# Patient Record
Sex: Female | Born: 1955 | Race: Black or African American | Hispanic: No | Marital: Single | Smoking: Never smoker
Health system: Southern US, Community
[De-identification: ages and names within clinical notes are randomized; demographics above are authoritative.]

## PROBLEM LIST (undated history)

## (undated) DIAGNOSIS — F329 Major depressive disorder, single episode, unspecified: Secondary | ICD-10-CM

## (undated) DIAGNOSIS — J45909 Unspecified asthma, uncomplicated: Secondary | ICD-10-CM

## (undated) DIAGNOSIS — K219 Gastro-esophageal reflux disease without esophagitis: Secondary | ICD-10-CM

## (undated) DIAGNOSIS — F32A Depression, unspecified: Secondary | ICD-10-CM

## (undated) DIAGNOSIS — I10 Essential (primary) hypertension: Secondary | ICD-10-CM

---

## 2014-05-28 ENCOUNTER — Encounter (HOSPITAL_BASED_OUTPATIENT_CLINIC_OR_DEPARTMENT_OTHER): Payer: Self-pay | Admitting: Emergency Medicine

## 2014-05-28 ENCOUNTER — Emergency Department (HOSPITAL_COMMUNITY): Payer: BC Managed Care – PPO

## 2014-05-28 ENCOUNTER — Emergency Department (HOSPITAL_BASED_OUTPATIENT_CLINIC_OR_DEPARTMENT_OTHER): Payer: BC Managed Care – PPO

## 2014-05-28 ENCOUNTER — Observation Stay (HOSPITAL_BASED_OUTPATIENT_CLINIC_OR_DEPARTMENT_OTHER)
Admission: EM | Admit: 2014-05-28 | Discharge: 2014-05-30 | Disposition: A | Discharge status: Home / Self Care | Payer: BC Managed Care – PPO | Attending: Internal Medicine | Admitting: Internal Medicine

## 2014-05-28 DIAGNOSIS — G459 Transient cerebral ischemic attack, unspecified: Secondary | ICD-10-CM | POA: Diagnosis present

## 2014-05-28 DIAGNOSIS — E119 Type 2 diabetes mellitus without complications: Secondary | ICD-10-CM | POA: Insufficient documentation

## 2014-05-28 DIAGNOSIS — R1012 Left upper quadrant pain: Secondary | ICD-10-CM | POA: Diagnosis not present

## 2014-05-28 DIAGNOSIS — F329 Major depressive disorder, single episode, unspecified: Secondary | ICD-10-CM | POA: Insufficient documentation

## 2014-05-28 DIAGNOSIS — R29898 Other symptoms and signs involving the musculoskeletal system: Secondary | ICD-10-CM | POA: Insufficient documentation

## 2014-05-28 DIAGNOSIS — J45909 Unspecified asthma, uncomplicated: Secondary | ICD-10-CM | POA: Insufficient documentation

## 2014-05-28 DIAGNOSIS — R51 Headache: Secondary | ICD-10-CM | POA: Diagnosis not present

## 2014-05-28 DIAGNOSIS — K219 Gastro-esophageal reflux disease without esophagitis: Secondary | ICD-10-CM | POA: Insufficient documentation

## 2014-05-28 DIAGNOSIS — Z79899 Other long term (current) drug therapy: Secondary | ICD-10-CM | POA: Insufficient documentation

## 2014-05-28 DIAGNOSIS — Z7982 Long term (current) use of aspirin: Secondary | ICD-10-CM | POA: Diagnosis not present

## 2014-05-28 DIAGNOSIS — R61 Generalized hyperhidrosis: Secondary | ICD-10-CM | POA: Diagnosis not present

## 2014-05-28 DIAGNOSIS — F3289 Other specified depressive episodes: Secondary | ICD-10-CM | POA: Insufficient documentation

## 2014-05-28 DIAGNOSIS — I10 Essential (primary) hypertension: Secondary | ICD-10-CM | POA: Diagnosis not present

## 2014-05-28 DIAGNOSIS — G458 Other transient cerebral ischemic attacks and related syndromes: Secondary | ICD-10-CM

## 2014-05-28 DIAGNOSIS — R0602 Shortness of breath: Secondary | ICD-10-CM | POA: Diagnosis not present

## 2014-05-28 DIAGNOSIS — K7689 Other specified diseases of liver: Secondary | ICD-10-CM | POA: Insufficient documentation

## 2014-05-28 HISTORY — DX: Gastro-esophageal reflux disease without esophagitis: K21.9

## 2014-05-28 HISTORY — DX: Depression, unspecified: F32.A

## 2014-05-28 HISTORY — DX: Unspecified asthma, uncomplicated: J45.909

## 2014-05-28 HISTORY — DX: Essential (primary) hypertension: I10

## 2014-05-28 HISTORY — DX: Major depressive disorder, single episode, unspecified: F32.9

## 2014-05-28 LAB — CBC
HEMATOCRIT: 38.4 % (ref 36.0–46.0)
Hemoglobin: 12.8 g/dL (ref 12.0–15.0)
MCH: 29 pg (ref 26.0–34.0)
MCHC: 33.3 g/dL (ref 30.0–36.0)
MCV: 87.1 fL (ref 78.0–100.0)
PLATELETS: 271 10*3/uL (ref 150–400)
RBC: 4.41 MIL/uL (ref 3.87–5.11)
RDW: 13.3 % (ref 11.5–15.5)
WBC: 10.5 10*3/uL (ref 4.0–10.5)

## 2014-05-28 LAB — COMPREHENSIVE METABOLIC PANEL
ALT: 27 U/L (ref 0–35)
ANION GAP: 12 (ref 5–15)
AST: 22 U/L (ref 0–37)
Albumin: 3.8 g/dL (ref 3.5–5.2)
Alkaline Phosphatase: 66 U/L (ref 39–117)
BILIRUBIN TOTAL: 0.2 mg/dL — AB (ref 0.3–1.2)
BUN: 13 mg/dL (ref 6–23)
CHLORIDE: 101 meq/L (ref 96–112)
CO2: 30 mEq/L (ref 19–32)
CREATININE: 0.9 mg/dL (ref 0.50–1.10)
Calcium: 9.7 mg/dL (ref 8.4–10.5)
GFR calc Af Amer: 80 mL/min — ABNORMAL LOW (ref >90)
GFR calc non Af Amer: 69 mL/min — ABNORMAL LOW (ref >90)
Glucose, Bld: 130 mg/dL — ABNORMAL HIGH (ref 70–99)
Potassium: 3.7 mEq/L (ref 3.7–5.3)
Sodium: 143 mEq/L (ref 137–147)
Total Protein: 7.2 g/dL (ref 6.0–8.3)

## 2014-05-28 LAB — TROPONIN I: Troponin I: <0.3 ng/mL (ref <0.30)

## 2014-05-28 NOTE — ED Notes (Signed)
Dr. Walden at [facility].  

## 2014-05-28 NOTE — ED Provider Notes (Signed)
CSN: [id number]     Arrival date & time 05/28/14  2120 History   First MD Initiated Contact with Patient 05/28/14 2139     Chief Complaint  Patient presents with  . Numbness     (Consider location/radiation/quality/duration/timing/severity/associated sxs/prior Treatment) HPI Inas Avena is a 58 y.o. female that presents to the ED for right arm weakness and numbness. Patient with history of Hypertension. At 2109 tonight, patient developed numbness and inability to move her arm off of the dining room table. She had associated diaphoresis, shortness of breath and an occipital headache. She took two 81mg  aspirin and promptly headed to the ED via transportation by daughter. On the way to the ED, symptoms resolved and she regained use of her arm. No chest pain. No lower extremity weakness. No facial droop. No changes in vision. No nausea or vomiting and no loss of consciousness.  Past Medical History  Diagnosis Date  . Hypertension   . Depression   . Asthma   GERD  Past Surgical History  Procedure Laterality Date  . Cesarean section     No family history on file. FH - Dad heart attack  History  Substance Use Topics  . Smoking status: Never Smoker   . Smokeless tobacco: Not on file  . Alcohol Use: No   OB History   Grav Para Term Preterm Abortions TAB SAB Ect Mult Living                 Review of Systems  Constitutional: Positive for diaphoresis. Negative for fever.  Respiratory: Positive for shortness of breath.   Cardiovascular: Negative for chest pain and palpitations.  Gastrointestinal: Negative for nausea and vomiting.  Neurological: Positive for weakness, numbness and headaches. Negative for tremors, seizures, syncope, facial asymmetry, speech difficulty and light-headedness.  All other systems reviewed and are negative.     Allergies  Review of patient's allergies indicates no known allergies.  Home Medications   Prior to Admission medications   Medication Sig  Start Date End Date Taking? Authorizing Provider  aspirin 81 MG chewable tablet Chew 81 mg by mouth daily.   Yes Historical Provider, MD  cyclobenzaprine (FLEXERIL) 10 MG tablet Take 10 mg by mouth 3 (three) times daily as needed for muscle spasms.   Yes Historical Provider, MD  diltiazem (CARDIZEM) 30 MG tablet Take 30 mg by mouth 4 (four) times daily.   Yes Historical Provider, MD  lisinopril-hydrochlorothiazide (PRINZIDE,ZESTORETIC) 20-25 MG per tablet Take 1 tablet by mouth daily.   Yes Historical Provider, MD  pantoprazole (PROTONIX) 40 MG tablet Take 40 mg by mouth daily.   Yes Historical Provider, MD  PARoxetine (PAXIL) 20 MG tablet Take 20 mg by mouth daily.   Yes Historical Provider, MD  potassium chloride (K-DUR) 10 MEQ tablet Take 10 mEq by mouth daily.   Yes Historical Provider, MD  ranitidine (ZANTAC) 150 MG capsule Take 150 mg by mouth 2 (two) times daily.   Yes Historical Provider, MD   BP 134/60  Pulse 86  Temp(Src) 99 F (37.2 C) (Oral)  Resp 22  Ht 5\' 3"  (1.6 m)  Wt 298 lb (135.172 kg)  BMI 52.80 kg/m2  SpO2 98% Physical Exam  Constitutional: She is oriented to person, place, and time. She appears well-developed and well-nourished.  Eyes: Conjunctivae and EOM are normal. Pupils are equal, round, and reactive to light.  Cardiovascular: Normal rate, regular rhythm and normal heart sounds.   Pulmonary/Chest: Effort normal and breath sounds normal. No respiratory  distress. She has no wheezes. She has no rales.  Abdominal: Soft. Bowel sounds are normal.  Musculoskeletal: Normal range of motion. She exhibits no edema and no tenderness.  Neurological: She is alert and oriented to person, place, and time. She has normal strength. No cranial nerve deficit or sensory deficit. Coordination and gait normal.  Reflex Scores:      Bicep reflexes are 2+ on the right side and 2+ on the left side.      Brachioradialis reflexes are 2+ on the right side and 2+ on the left side.       Patellar reflexes are 2+ on the right side and 2+ on the left side. Skin: Skin is warm and dry.    ED Course  Procedures (including critical care time) Medications - No data to display Labs Review Labs Reviewed  COMPREHENSIVE METABOLIC PANEL - Abnormal; Notable for the following:    Glucose, Bld 130 (*)    Total Bilirubin 0.2 (*)    GFR calc non Af Amer 69 (*)    GFR calc Af Amer 80 (*)    All other components within normal limits  CBC  TROPONIN I  URINALYSIS, ROUTINE W REFLEX MICROSCOPIC    Imaging Review Dg Chest 2 View  05/28/2014   CLINICAL DATA:  Right arm and right hand numbness, extending to the right shoulder. Shortness of breath. History of asthma.  EXAM: CHEST  2 VIEW  COMPARISON:  None.  FINDINGS: The lungs are well-aerated and clear. There is no evidence of focal opacification, pleural effusion or pneumothorax.  The heart is normal in size; the mediastinal contour is within normal limits. No acute osseous abnormalities are seen.  IMPRESSION: No acute cardiopulmonary process seen.   Electronically Signed   By: Roanna Raider M.D.   On: 05/28/2014 22:39   Ct Head Wo Contrast  05/28/2014   CLINICAL DATA:  Right-sided headache and right-sided numbness.  EXAM: CT HEAD WITHOUT CONTRAST  TECHNIQUE: Contiguous axial images were obtained from the base of the skull through the vertex without intravenous contrast.  COMPARISON:  None.  FINDINGS: There is no evidence of acute infarction, mass lesion, or intra- or extra-axial hemorrhage on CT. Evaluation is mildly suboptimal due to motion artifact.  The posterior fossa, including the cerebellum, brainstem and fourth ventricle, is within normal limits. The third and lateral ventricles, and basal ganglia are unremarkable in appearance. The cerebral hemispheres are symmetric in appearance, with normal gray-white differentiation. No mass effect or midline shift is seen.  There is no evidence of fracture; visualized osseous structures are  unremarkable in appearance. The visualized portions of the orbits are within normal limits. The paranasal sinuses and mastoid air cells are well-aerated. No significant soft tissue abnormalities are seen.  IMPRESSION: Unremarkable noncontrast CT of the head.   Electronically Signed   By: Roanna Raider M.D.   On: 05/28/2014 22:42     EKG Interpretation   Date/Time:  Tuesday May 28 2014 21:37:36 EDT Ventricular Rate:  86 PR Interval:  156 QRS Duration: 82 QT Interval:  392 QTC Calculation: 469 R Axis:   76 Text Interpretation:  Normal sinus rhythm Normal ECG No prior Confirmed by  Gwendolyn Grant  MD, BLAIR ([id number]) on 05/28/2014 9:38:44 PM      MDM   Final diagnoses:  Other specified transient cerebral ischemias  Right arm weakness   Patient's symptoms consistent with TIA and possible ACS. Labs essentially unremarkable. Initial troponin negative. EKG not suggestive of acute ischemia. CT  head shows no acute infarct or hemorrhage. Patient currently without symptoms of numbness, chest pain, shortness of breath or weakness. Will need admission for work-up. Spoke with Dr. Onalee Huaavid who is admitting. Patient placed in observation, telemetry bed.   Jacquelin Hawkingalph Wendy Hoback, MD 05/28/14 [phone]

## 2014-05-28 NOTE — ED Notes (Signed)
Pt. Reports she felt numbness in the R arm and R hand up to her R shoulder at approx. 2109 tonight. Pt. Able to move her R arm at present time.  Pt. Daughter reports the Pt. Called her name multiple times at the time of her having numbness in the R arm and hand.  Pt. Felt pain in the R arm as well.  Pt. Arm and grip WNL in triage.  Pt. Alert and oriented with no resp. Distress noted.

## 2014-05-28 NOTE — ED Notes (Signed)
MD at bedside. 

## 2014-05-28 NOTE — ED Notes (Signed)
To CT

## 2014-05-29 ENCOUNTER — Observation Stay (HOSPITAL_COMMUNITY): Payer: BC Managed Care – PPO

## 2014-05-29 ENCOUNTER — Encounter (HOSPITAL_COMMUNITY): Payer: Self-pay | Admitting: Internal Medicine

## 2014-05-29 DIAGNOSIS — G458 Other transient cerebral ischemic attacks and related syndromes: Secondary | ICD-10-CM

## 2014-05-29 DIAGNOSIS — R29898 Other symptoms and signs involving the musculoskeletal system: Secondary | ICD-10-CM

## 2014-05-29 DIAGNOSIS — I10 Essential (primary) hypertension: Secondary | ICD-10-CM | POA: Diagnosis present

## 2014-05-29 LAB — LIPID PANEL
CHOL/HDL RATIO: 2.9 ratio
CHOLESTEROL: 168 mg/dL (ref 0–200)
HDL: 58 mg/dL (ref >39)
LDL Cholesterol: 88 mg/dL (ref 0–99)
Triglycerides: 108 mg/dL (ref <150)
VLDL: 22 mg/dL (ref 0–40)

## 2014-05-29 LAB — URINALYSIS, ROUTINE W REFLEX MICROSCOPIC
BILIRUBIN URINE: NEGATIVE
Glucose, UA: NEGATIVE mg/dL
Hgb urine dipstick: NEGATIVE
KETONES UR: NEGATIVE mg/dL
LEUKOCYTES UA: NEGATIVE
NITRITE: NEGATIVE
Protein, ur: NEGATIVE mg/dL
Specific Gravity, Urine: 1.019 (ref 1.005–1.030)
UROBILINOGEN UA: 0.2 mg/dL (ref 0.0–1.0)
pH: 5.5 (ref 5.0–8.0)

## 2014-05-29 LAB — HEMOGLOBIN A1C
Hgb A1c MFr Bld: 7 % — ABNORMAL HIGH (ref <5.7)
MEAN PLASMA GLUCOSE: 154 mg/dL — AB (ref <117)

## 2014-05-29 LAB — LIPASE, BLOOD: Lipase: 27 U/L (ref 11–59)

## 2014-05-29 MED ORDER — FAMOTIDINE 10 MG PO TABS
10.0000 mg | ORAL_TABLET | Freq: Two times a day (BID) | ORAL | Status: DC
  Administered 2014-05-29 – 2014-05-30 (×3): 10 mg via ORAL
  Filled 2014-05-29 (×3): qty 1

## 2014-05-29 MED ORDER — CYCLOBENZAPRINE HCL 10 MG PO TABS: 10.0000 mg | ORAL_TABLET | Freq: Three times a day (TID) | ORAL | Status: DC | PRN

## 2014-05-29 MED ORDER — STROKE: EARLY STAGES OF RECOVERY BOOK
Freq: Once | Status: DC
  Filled 2014-05-29: qty 1

## 2014-05-29 MED ORDER — DILTIAZEM HCL 30 MG PO TABS
30.0000 mg | ORAL_TABLET | Freq: Four times a day (QID) | ORAL | Status: DC
  Administered 2014-05-29 – 2014-05-30 (×7): 30 mg via ORAL
  Filled 2014-05-29 (×7): qty 1

## 2014-05-29 MED ORDER — POTASSIUM CHLORIDE ER 10 MEQ PO TBCR
10.0000 meq | EXTENDED_RELEASE_TABLET | Freq: Every day | ORAL | Status: DC
  Administered 2014-05-29 – 2014-05-30 (×2): 10 meq via ORAL
  Filled 2014-05-29 (×4): qty 1

## 2014-05-29 MED ORDER — HEPARIN SODIUM (PORCINE) 5000 UNIT/ML IJ SOLN
5000.0000 [IU] | Freq: Three times a day (TID) | INTRAMUSCULAR | Status: DC
  Administered 2014-05-29 – 2014-05-30 (×5): 5000 [IU] via SUBCUTANEOUS
  Filled 2014-05-29 (×5): qty 1

## 2014-05-29 MED ORDER — PANTOPRAZOLE SODIUM 40 MG PO TBEC
40.0000 mg | DELAYED_RELEASE_TABLET | Freq: Every day | ORAL | Status: DC
  Administered 2014-05-29 – 2014-05-30 (×2): 40 mg via ORAL
  Filled 2014-05-29 (×2): qty 1

## 2014-05-29 MED ORDER — LISINOPRIL-HYDROCHLOROTHIAZIDE 20-25 MG PO TABS: 1.0000 | ORAL_TABLET | Freq: Every day | ORAL | Status: DC

## 2014-05-29 MED ORDER — PAROXETINE HCL 20 MG PO TABS
20.0000 mg | ORAL_TABLET | Freq: Every day | ORAL | Status: DC
  Administered 2014-05-29 – 2014-05-30 (×2): 20 mg via ORAL
  Filled 2014-05-29 (×2): qty 1

## 2014-05-29 MED ORDER — ASPIRIN 81 MG PO CHEW
81.0000 mg | CHEWABLE_TABLET | Freq: Every day | ORAL | Status: DC
  Administered 2014-05-29 – 2014-05-30 (×2): 81 mg via ORAL
  Filled 2014-05-29 (×2): qty 1

## 2014-05-29 MED ORDER — LISINOPRIL 20 MG PO TABS
20.0000 mg | ORAL_TABLET | Freq: Every day | ORAL | Status: DC
  Administered 2014-05-29 – 2014-05-30 (×2): 20 mg via ORAL
  Filled 2014-05-29 (×2): qty 1

## 2014-05-29 MED ORDER — METFORMIN HCL 500 MG PO TABS
500.0000 mg | ORAL_TABLET | Freq: Two times a day (BID) | ORAL | Status: DC
  Administered 2014-05-29 – 2014-05-30 (×3): 500 mg via ORAL
  Filled 2014-05-29 (×3): qty 1

## 2014-05-29 MED ORDER — HYDROCHLOROTHIAZIDE 25 MG PO TABS
25.0000 mg | ORAL_TABLET | Freq: Every day | ORAL | Status: DC
  Administered 2014-05-29 – 2014-05-30 (×2): 25 mg via ORAL
  Filled 2014-05-29 (×2): qty 1

## 2014-05-29 NOTE — H&P (Signed)
[facility] History and Physical  Karen Herring WUJ:[id number] DOB: 08/04/1956 DOA: 05/28/2014  Referring physician: EDP PCP: No primary provider on file.   Chief Complaint: RUE weakness   HPI: Karen Herring is a 58 y.o. female who presents to the ED at [facility] with transient RUE weakness that occurred earlier this evening while eating dinner.  Symptoms lasted 15 mins in duration and resolved without intervention en-route to the ED.  Patient is now symptom free.  Patient does not report any difficulty with speech or know of any facial droop during the arm symptom episode.  She was also able to ambulate to car without difficulty she reports although she initially had been "afraid she wouldn't be able to get up".  Patient has been transferred to [facility] for TIA work up.  Review of Systems: Systems reviewed.  As above, otherwise negative  Past Medical History  Diagnosis Date  . Hypertension   . Depression   . Asthma    Past Surgical History  Procedure Laterality Date  . Cesarean section     Social History:  reports that she has never smoked. She does not have any smokeless tobacco history on file. She reports that she does not drink alcohol. Her drug history is not on file.  No Known Allergies  No family history on file.   Prior to Admission medications   Medication Sig Start Date End Date Taking? Authorizing Provider  aspirin 81 MG chewable tablet Chew 81 mg by mouth daily.   Yes Historical Provider, MD  cyclobenzaprine (FLEXERIL) 10 MG tablet Take 10 mg by mouth 3 (three) times daily as needed for muscle spasms.   Yes Historical Provider, MD  diltiazem (CARDIZEM) 30 MG tablet Take 30 mg by mouth 4 (four) times daily.   Yes Historical Provider, MD  lisinopril-hydrochlorothiazide (PRINZIDE,ZESTORETIC) 20-25 MG per tablet Take 1 tablet by mouth daily.   Yes Historical Provider, MD  pantoprazole (PROTONIX) 40 MG tablet Take 40 mg by mouth daily.   Yes Historical Provider, MD  PARoxetine  (PAXIL) 20 MG tablet Take 20 mg by mouth daily.   Yes Historical Provider, MD  potassium chloride (K-DUR) 10 MEQ tablet Take 10 mEq by mouth daily.   Yes Historical Provider, MD  ranitidine (ZANTAC) 150 MG capsule Take 150 mg by mouth 2 (two) times daily.   Yes Historical Provider, MD   Physical Exam: Filed Vitals:   05/29/14 0136  BP: 179/70  Pulse: 80  Temp: 99.2 F (37.3 C)  Resp: 20    BP 179/70  Pulse 80  Temp(Src) 99.2 F (37.3 C) (Oral)  Resp 20  Ht 5\' 3"  (1.6 m)  Wt 135.5 kg (298 lb 11.6 oz)  BMI 52.93 kg/m2  SpO2 95%  General Appearance:    Alert, oriented, no distress, appears stated age  Head:    Normocephalic, atraumatic  Eyes:    PERRL, EOMI, sclera non-icteric        Nose:   Nares without drainage or epistaxis. Mucosa, turbinates normal  Throat:   Moist mucous membranes. Oropharynx without erythema or exudate.  Neck:   Supple. No carotid bruits.  No thyromegaly.  No lymphadenopathy.   Back:     No CVA tenderness, no spinal tenderness  Lungs:     Clear to auscultation bilaterally, without wheezes, rhonchi or rales  Chest wall:    No tenderness to palpitation  Heart:    Regular rate and rhythm without murmurs, gallops, rubs  Abdomen:     Soft,  non-tender, nondistended, normal bowel sounds, no organomegaly  Genitalia:    deferred  Rectal:    deferred  Extremities:   No clubbing, cyanosis or edema.  Pulses:   2+ and symmetric all extremities  Skin:   Skin color, texture, turgor normal, no rashes or lesions  Lymph nodes:   Cervical, supraclavicular, and axillary nodes normal  Neurologic:   CNII-XII intact. Normal strength, sensation and reflexes      throughout    Labs on Admission:  Basic Metabolic Panel:  Recent Labs Lab 05/28/14 2227  NA 143  K 3.7  CL 101  CO2 30  GLUCOSE 130*  BUN 13  CREATININE 0.90  CALCIUM 9.7   Liver Function Tests:  Recent Labs Lab 05/28/14 2227  AST 22  ALT 27  ALKPHOS 66  BILITOT 0.2*  PROT 7.2  ALBUMIN 3.8    No results found for this basename: LIPASE, AMYLASE,  in the last 168 hours No results found for this basename: AMMONIA,  in the last 168 hours CBC:  Recent Labs Lab 05/28/14 2227  WBC 10.5  HGB 12.8  HCT 38.4  MCV 87.1  PLT 271   Cardiac Enzymes:  Recent Labs Lab 05/28/14 2227  TROPONINI <0.30    BNP (last 3 results) No results found for this basename: PROBNP,  in the last 8760 hours CBG: No results found for this basename: GLUCAP,  in the last 168 hours  Radiological Exams on Admission: Dg Chest 2 View  05/28/2014   CLINICAL DATA:  Right arm and right hand numbness, extending to the right shoulder. Shortness of breath. History of asthma.  EXAM: CHEST  2 VIEW  COMPARISON:  None.  FINDINGS: The lungs are well-aerated and clear. There is no evidence of focal opacification, pleural effusion or pneumothorax.  The heart is normal in size; the mediastinal contour is within normal limits. No acute osseous abnormalities are seen.  IMPRESSION: No acute cardiopulmonary process seen.   Electronically Signed   By: Roanna Raider M.D.   On: 05/28/2014 22:39   Ct Head Wo Contrast  05/28/2014   CLINICAL DATA:  Right-sided headache and right-sided numbness.  EXAM: CT HEAD WITHOUT CONTRAST  TECHNIQUE: Contiguous axial images were obtained from the base of the skull through the vertex without intravenous contrast.  COMPARISON:  None.  FINDINGS: There is no evidence of acute infarction, mass lesion, or intra- or extra-axial hemorrhage on CT. Evaluation is mildly suboptimal due to motion artifact.  The posterior fossa, including the cerebellum, brainstem and fourth ventricle, is within normal limits. The third and lateral ventricles, and basal ganglia are unremarkable in appearance. The cerebral hemispheres are symmetric in appearance, with normal gray-white differentiation. No mass effect or midline shift is seen.  There is no evidence of fracture; visualized osseous structures are unremarkable in  appearance. The visualized portions of the orbits are within normal limits. The paranasal sinuses and mastoid air cells are well-aerated. No significant soft tissue abnormalities are seen.  IMPRESSION: Unremarkable noncontrast CT of the head.   Electronically Signed   By: Roanna Raider M.D.   On: 05/28/2014 22:42    EKG: Independently reviewed.  Assessment/Plan Principal Problem:   TIA (transient ischemic attack)   1. TIA - have ordered MRI/MRA head to evaluate for evidence of ischemia, if present then needs the rest of the stroke work up ordered (2d echo, carotid duplex, etc).  Tele monitor to observe for arrythmia. 2. HTN - continue home meds, cardizem is used for  BP control she reports, does not have a known history of A.Fib or other arrythmia.    Code Status: Full Code  Family Communication: No family in room Disposition Plan: Admit to inpatient   Time spent: 70 min  GARDNER, JARED M. [facility] Pager [phone]  If 7AM-7PM, please contact the day team taking care of the patient [email or web address] Password [facility] [email or web address] 05/29/2014, 4:09 AM

## 2014-05-29 NOTE — Progress Notes (Addendum)
[facility] PROGRESS NOTE  Karen Herring ZOX:[id number] DOB: Apr 18, 1956 DOA: 05/28/2014 PCP: No primary provider on file.  Assessment/Plan: Principal Problem:   TIA (transient ischemic attack) Active Problems:   HTN (hypertension)  Probable TIA R arm weakness and numbness  Continue aspirin for secondary stroke prevention Normal MRI MR angiography MRA negative Triglycerides 108 Hemoglobin A1c 7.0 PT/OT eval 2-D echo pending  Diabetes mellitus, type II Hemoglobin A1c 7.0 Patient agreeable to be started on metformin  Hypertension Continue Cardizem, HCTZ, lisinopril  Left upper quadrant pain Patient complaining of pain under her left breast This is reproducible and likely musculoskeletal Chest x-ray negative Obtain abdominal ultrasound to rule out cyst, gallstones Continue to cycle cardiac enzymes  Code Status: full Family Communication: family updated about patient's clinical progress Disposition Plan:  As above    Brief narrative: 58 y.o. female who presents to the ED at [facility] with transient RUE weakness that occurred earlier this evening while eating dinner. Symptoms lasted 15 mins in duration and resolved without intervention en-route ED. Patient is now symptom free. Patient does not report any difficulty with speech or know of any facial droop during the arm symptom episode. She was also able to ambulate to car without difficulty she reports although she initially had been "afraid she wouldn't be able to get up". Patient has been transferred to [facility] for TIA work up   Consultants:  None  Procedures:  None  Antibiotics:  None  HPI/Subjective: Improved right-sided weakness, not symptom-free  Objective: Filed Vitals:   05/29/14 0553 05/29/14 0800 05/29/14 1000 05/29/14 1351  BP: 138/65 141/65 140/59 125/56  Pulse: 74 68 68 74  Temp: 98.6 F (37 C) 98.4 F (36.9 C) 98.2 F (36.8 C) 98.6 F (37 C)  TempSrc: Oral Oral Oral Oral  Resp: 18 18 18 18    Height:      Weight:      SpO2: 97% 97% 97% 97%    Intake/Output Summary (Last 24 hours) at 05/29/14 1531 Last data filed at 05/29/14 1300  Gross per 24 hour  Intake    360 ml  Output      0 ml  Net    360 ml    Exam:  HENT:  Head: Atraumatic.  Nose: Nose normal.  Mouth/Throat: Oropharynx is clear and moist.  Eyes: Conjunctivae are normal. Pupils are equal, round, and reactive to light. No scleral icterus.  Neck: Neck supple. No tracheal deviation present.  Cardiovascular: Normal rate, regular rhythm, normal heart sounds and intact distal pulses.  Pulmonary/Chest: Effort normal and breath sounds normal. No respiratory distress.  Abdominal: Soft. Normal appearance and bowel sounds are normal. She exhibits no distension. There is no tenderness.  Musculoskeletal: She exhibits no edema and no tenderness.  Neurological: She is alert. No cranial nerve deficit.    Data Reviewed: Basic Metabolic Panel:  Recent Labs Lab 05/28/14 2227  NA 143  K 3.7  CL 101  CO2 30  GLUCOSE 130*  BUN 13  CREATININE 0.90  CALCIUM 9.7    Liver Function Tests:  Recent Labs Lab 05/28/14 2227  AST 22  ALT 27  ALKPHOS 66  BILITOT 0.2*  PROT 7.2  ALBUMIN 3.8   No results found for this basename: LIPASE, AMYLASE,  in the last 168 hours No results found for this basename: AMMONIA,  in the last 168 hours  CBC:  Recent Labs Lab 05/28/14 2227  WBC 10.5  HGB 12.8  HCT 38.4  MCV 87.1  PLT 271  Cardiac Enzymes:  Recent Labs Lab 05/28/14 2227  TROPONINI <0.30   BNP (last 3 results) No results found for this basename: PROBNP,  in the last 8760 hours   CBG: No results found for this basename: GLUCAP,  in the last 168 hours  No results found for this or any previous visit (from the past 240 hour(s)).   Studies: Dg Chest 2 View  05/28/2014   CLINICAL DATA:  Right arm and right hand numbness, extending to the right shoulder. Shortness of breath. History of asthma.  EXAM:  CHEST  2 VIEW  COMPARISON:  None.  FINDINGS: The lungs are well-aerated and clear. There is no evidence of focal opacification, pleural effusion or pneumothorax.  The heart is normal in size; the mediastinal contour is within normal limits. No acute osseous abnormalities are seen.  IMPRESSION: No acute cardiopulmonary process seen.   Electronically Signed   By: Roanna Raider M.D.   On: 05/28/2014 22:39   Ct Head Wo Contrast  05/28/2014   CLINICAL DATA:  Right-sided headache and right-sided numbness.  EXAM: CT HEAD WITHOUT CONTRAST  TECHNIQUE: Contiguous axial images were obtained from the base of the skull through the vertex without intravenous contrast.  COMPARISON:  None.  FINDINGS: There is no evidence of acute infarction, mass lesion, or intra- or extra-axial hemorrhage on CT. Evaluation is mildly suboptimal due to motion artifact.  The posterior fossa, including the cerebellum, brainstem and fourth ventricle, is within normal limits. The third and lateral ventricles, and basal ganglia are unremarkable in appearance. The cerebral hemispheres are symmetric in appearance, with normal gray-white differentiation. No mass effect or midline shift is seen.  There is no evidence of fracture; visualized osseous structures are unremarkable in appearance. The visualized portions of the orbits are within normal limits. The paranasal sinuses and mastoid air cells are well-aerated. No significant soft tissue abnormalities are seen.  IMPRESSION: Unremarkable noncontrast CT of the head.   Electronically Signed   By: Roanna Raider M.D.   On: 05/28/2014 22:42   Mr Brain Wo Contrast  05/29/2014   CLINICAL DATA:  Transient right upper extremity weakness.  EXAM: MRI HEAD WITHOUT CONTRAST  MRA HEAD WITHOUT CONTRAST  TECHNIQUE: Multiplanar, multiecho pulse sequences of the brain and surrounding structures were obtained without intravenous contrast. Angiographic images of the head were obtained using MRA technique without  contrast.  COMPARISON:  Head CT 05/28/2014  FINDINGS: MRI HEAD FINDINGS  The brain has a normal appearance on all pulse sequences without evidence of malformation, atrophy, old or acute infarction, mass lesion, hemorrhage, hydrocephalus or extra-axial collection. No pituitary mass. No fluid in the sinuses, middle ears or mastoids. No skull or skullbase lesion. There is flow in the major vessels at the base of the brain. Major venous sinuses show flow.  MRA HEAD FINDINGS  Both internal carotid arteries are widely patent into the brain. No siphon stenosis. The anterior and middle cerebral vessels are patent without proximal stenosis, aneurysm or vascular malformation.  Both vertebral arteries are patent to the basilar. No vertebral stenosis. Subtraction artifact simulates a left vertebral stenosis. No basilar stenosis. Posterior circulation branch vessels are patent. Large posterior communicating arteries bilaterally.  IMPRESSION: Normal MRI of the brain.  Normal intracranial MR angiography.   Electronically Signed   By: Paulina Fusi M.D.   On: 05/29/2014 13:54   Mr Maxine Glenn Head/brain Wo Cm  05/29/2014   CLINICAL DATA:  Transient right upper extremity weakness.  EXAM: MRI HEAD WITHOUT CONTRAST  MRA HEAD WITHOUT CONTRAST  TECHNIQUE: Multiplanar, multiecho pulse sequences of the brain and surrounding structures were obtained without intravenous contrast. Angiographic images of the head were obtained using MRA technique without contrast.  COMPARISON:  Head CT 05/28/2014  FINDINGS: MRI HEAD FINDINGS  The brain has a normal appearance on all pulse sequences without evidence of malformation, atrophy, old or acute infarction, mass lesion, hemorrhage, hydrocephalus or extra-axial collection. No pituitary mass. No fluid in the sinuses, middle ears or mastoids. No skull or skullbase lesion. There is flow in the major vessels at the base of the brain. Major venous sinuses show flow.  MRA HEAD FINDINGS  Both internal carotid  arteries are widely patent into the brain. No siphon stenosis. The anterior and middle cerebral vessels are patent without proximal stenosis, aneurysm or vascular malformation.  Both vertebral arteries are patent to the basilar. No vertebral stenosis. Subtraction artifact simulates a left vertebral stenosis. No basilar stenosis. Posterior circulation branch vessels are patent. Large posterior communicating arteries bilaterally.  IMPRESSION: Normal MRI of the brain.  Normal intracranial MR angiography.   Electronically Signed   By: Paulina FusiMark  Shogry M.D.   On: 05/29/2014 13:54    Scheduled Meds: .  stroke: mapping our early stages of recovery book   Does not apply Once  . aspirin  81 mg Oral Daily  . diltiazem  30 mg Oral 4 times per day  . famotidine  10 mg Oral BID  . heparin  5,000 Units Subcutaneous 3 times per day  . hydrochlorothiazide  25 mg Oral Daily  . lisinopril  20 mg Oral Daily  . metFORMIN  500 mg Oral BID WC  . pantoprazole  40 mg Oral Daily  . PARoxetine  20 mg Oral Daily  . potassium chloride  10 mEq Oral Daily   Continuous Infusions:   Principal Problem:   TIA (transient ischemic attack) Active Problems:   HTN (hypertension)    Time spent: 40 minutes   Northwest Plaza Asc LLCBROL,Glendy Barsanti  [facility] Pager [phone]. If 8PM-8AM, please contact night-coverage at [email or web address], password [email or web address] 05/29/2014, 3:31 PM  LOS: 1 day

## 2014-05-29 NOTE — Progress Notes (Signed)
Patient arrived to the unit via carelink. This RN assessed patient and oriented patient to the unit and her room. Call light and room phone are in reach. Bed alarm on. Will continue to monitor this patient. Monia PouchShakenna Satina Jerrell, RN

## 2014-05-29 NOTE — ED Provider Notes (Signed)
I saw and evaluated the patient, reviewed the resident's note and I agree with the findings and plan.   EKG Interpretation   Date/Time:  Tuesday May 28 2014 21:37:36 EDT Ventricular Rate:  86 PR Interval:  156 QRS Duration: 82 QT Interval:  392 QTC Calculation: 469 R Axis:   76 Text Interpretation:  Normal sinus rhythm Normal ECG No prior Confirmed by  Gwendolyn GrantWALDEN  MD, Junita Kubota ([id number]) on 05/28/2014 9:38:44 PM      R arm weakness and numbness lasting about 15 minutes. Neuro exam ok now. History and physical c/w TIA. Head CT ok. [facility]  Dagmar HaitWilliam Jaeven Wanzer, MD 05/29/14 0005

## 2014-05-29 NOTE — Evaluation (Signed)
Occupational Therapy Evaluation Patient Details Name: Karen LatinoMary Herring MRN: [id number] DOB: 1955-11-10 Today's Date: 05/29/2014    History of Present Illness 58 y.o. admitted with RUE weakness/numbness. MRI negative.    Clinical Impression   Pt admitted with above. Education provided and no further OT needs at this time.     Follow Up Recommendations  No OT follow up    Equipment Recommendations  None recommended by OT    Recommendations for Other Services       Precautions / Restrictions Restrictions Weight Bearing Restrictions: No      Mobility Bed Mobility Overal bed mobility: Modified Independent                Transfers Overall transfer level: Needs assistance   Transfers: Sit to/from Stand Sit to Stand: Supervision;Modified independent (Device/Increase time)         General transfer comment: when standing initially from bed, a little unstable (I think due to knee).    Balance                                            ADL Overall ADL's : Needs assistance/impaired             Lower Body Bathing: Supervison/ safety (standing)       Lower Body Dressing: Supervision/safety;Sit to/from stand   Toilet Transfer: Modified Independent;Regular Toilet;Grab bars       Tub/ Shower Transfer: Tub transfer;Min guard;Ambulation   Functional mobility during ADLs: Modified independent;Supervision/safety-pt out of breath with ambulation General ADL Comments: Educated on signs/symptoms of stroke and importance of getting help right away. Also wrote down BE FAST information on handout for pt. Explained to pt what a TIA was and also recommended to avoid eating canned foods but try frozen or fresh veggies instead. Recommended pt try to find something she enjoys doing and try to relax as she has a lot on her and has stress.Recommended pt not get down in her bathtub at home.Recommended someone be with her for tub transfer. Pt initially Min guard to  step out of tub but practiced again and did better. Educated on long handled sponge and what to use for toilet aid (showed pt toilet aid tongs and also a pair of grill tongs). Pt appreciative of information. Pt states that hygiene is difficult for her.  Recommended sitting for most of LB dressing.      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     Hand Dominance Right   Extremity/Trunk Assessment Upper Extremity Assessment Upper Extremity Assessment: Overall WFL for tasks assessed   Lower Extremity Assessment Lower Extremity Assessment: Overall WFL for tasks assessed (reports knee problems)       Communication Communication Communication: No difficulties   Cognition Arousal/Alertness: Awake/alert Behavior During Therapy: WFL for tasks assessed/performed Overall Cognitive Status: Within Functional Limits for tasks assessed                     General Comments       Exercises       Shoulder  Instructions      Home Living Family/patient expects to be discharged to:: Private residence Living Arrangements: Alone   Type of Home: Mobile home Home Access: Stairs to enter Entrance Stairs-Number of Steps: 3-4 Entrance Stairs-Rails: Right Home Layout: One level     Bathroom Shower/Tub: Chief Strategy Officer: Standard (sink close)     Home Equipment: Other (comment) (access to DME)          Prior Functioning/Environment Level of Independence: Independent             OT Diagnosis:     OT Problem List:     OT Treatment/Interventions:      OT Goals(Current goals can be found in the care plan section)    OT Frequency:     Barriers to D/C:            Co-evaluation              End of  Session Equipment Utilized During Treatment: Gait belt  Activity Tolerance: Patient tolerated treatment well Patient left: in bed;with family/visitor present;with nursing/sitter in room   Time: 1700-1734 OT Time Calculation (min): 34 min Charges:  OT General Charges $OT Visit: 1 Procedure OT Evaluation $Initial OT Evaluation Tier I: 1 Procedure OT Treatments $Therapeutic Activity: 8-22 mins G-Codes: OT G-codes **NOT FOR INPATIENT CLASS** Functional Assessment Tool Used: clinical judgment Functional Limitation: Self care Self Care Current Status (Z6109): At least 1 percent but less than 20 percent impaired, limited or restricted Self Care Goal Status (U0454): At least 1 percent but less than 20 percent impaired, limited or restricted Self Care Discharge Status [phone]): At least 1 percent but less than 20 percent impaired, limited or restricted  Earlie Raveling OTR/L [phone] 05/29/2014, 6:47 PM

## 2014-05-30 ENCOUNTER — Observation Stay (HOSPITAL_COMMUNITY): Payer: BC Managed Care – PPO

## 2014-05-30 DIAGNOSIS — I369 Nonrheumatic tricuspid valve disorder, unspecified: Secondary | ICD-10-CM

## 2014-05-30 LAB — COMPREHENSIVE METABOLIC PANEL
ALBUMIN: 3.3 g/dL — AB (ref 3.5–5.2)
ALK PHOS: 54 U/L (ref 39–117)
ALT: 26 U/L (ref 0–35)
AST: 22 U/L (ref 0–37)
Anion gap: 12 (ref 5–15)
BUN: 10 mg/dL (ref 6–23)
CALCIUM: 8.8 mg/dL (ref 8.4–10.5)
CO2: 27 mEq/L (ref 19–32)
Chloride: 99 mEq/L (ref 96–112)
Creatinine, Ser: 0.71 mg/dL (ref 0.50–1.10)
GFR calc non Af Amer: >90 mL/min (ref >90)
GLUCOSE: 121 mg/dL — AB (ref 70–99)
Potassium: 4.1 mEq/L (ref 3.7–5.3)
Sodium: 138 mEq/L (ref 137–147)
Total Bilirubin: 0.4 mg/dL (ref 0.3–1.2)
Total Protein: 6.2 g/dL (ref 6.0–8.3)

## 2014-05-30 LAB — GLUCOSE, CAPILLARY
GLUCOSE-CAPILLARY: 122 mg/dL — AB (ref 70–99)
Glucose-Capillary: 97 mg/dL (ref 70–99)

## 2014-05-30 MED ORDER — LISINOPRIL 20 MG PO TABS
20.0000 mg | ORAL_TABLET | Freq: Every day | ORAL | Status: AC
Start: 2014-05-30 — End: ?

## 2014-05-30 MED ORDER — FUROSEMIDE 40 MG PO TABS: 40.0000 mg | ORAL_TABLET | Freq: Every day | ORAL | Status: AC

## 2014-05-30 MED ORDER — METFORMIN HCL 500 MG PO TABS: 500.0000 mg | ORAL_TABLET | Freq: Two times a day (BID) | ORAL | Status: AC

## 2014-05-30 NOTE — Evaluation (Signed)
Physical Therapy Evaluation Patient Details Name: Karen Herring MRN: [id number] DOB: [birth year] Today's Date: 05/30/2014   History of Present Illness  58 y.o. admitted with RUE weakness/numbness. MRI negative.  determined to be TIA per MD note.  Clinical Impression  Pt adm from home secondary to above. Seen for evaluation and no focal weakness or deficits indicated. Pt able to perform high level balance activities and negotiate stairs without difficulty. Educated on "BE FAST" to promote importance of early detection of stroke, pt verbalized understanding. No further acute PT needs warranted.     Follow Up Recommendations No PT follow up    Equipment Recommendations  None recommended by PT    Recommendations for Other Services       Precautions / Restrictions Precautions Precautions: None Restrictions Weight Bearing Restrictions: No      Mobility  Bed Mobility Overal bed mobility: Independent                Transfers Overall transfer level: Modified independent Equipment used: None Transfers: Sit to/from Stand Sit to Stand: Modified independent (Device/Increase time)         General transfer comment: using arm rests and/or handrails to power up; no sway or LOB noted  Ambulation/Gait Ambulation/Gait assistance: Modified independent (Device/Increase time) Ambulation Distance (Feet): 400 Feet Assistive device: None Gait Pattern/deviations: WFL(Within Functional Limits) (externally rotated LEs - baseline per pt) Gait velocity: WFL  Gait velocity interpretation: at or above normal speed for age/gender General Gait Details: no LOB noted with high level balance activities   Stairs Stairs: Yes Stairs assistance: Modified independent (Device/Increase time) Stair Management: One rail Right;Step to pattern;Forwards Number of Stairs: 5 General stair comments: no LOB noted; demo'd safe technique   Wheelchair Mobility    Modified Rankin (Stroke Patients  Only) Modified Rankin (Stroke Patients Only) Pre-Morbid Rankin Score: No symptoms Modified Rankin: No significant disability     Balance Overall balance assessment: Modified Independent                           High level balance activites: Direction changes;Head turns;Sudden stops;Backward walking High Level Balance Comments: no LOB noted              Pertinent Vitals/Pain Denies any pain     Home Living Family/patient expects to be discharged to:: Private residence Living Arrangements: Alone Available Help at Discharge: Family;Available PRN/intermittently Type of Home: Mobile home Home Access: Stairs to enter Entrance Stairs-Rails: Right Entrance Stairs-Number of Steps: 3-4 Home Layout: One level Home Equipment: Other (comment) Additional Comments: takes care of sister and father; will be D/C with family initially     Prior Function Level of Independence: Independent               Hand Dominance   Dominant Hand: Right    Extremity/Trunk Assessment   Upper Extremity Assessment: Defer to OT evaluation           Lower Extremity Assessment: Overall WFL for tasks assessed      Cervical / Trunk Assessment: Normal  Communication   Communication: No difficulties  Cognition Arousal/Alertness: Awake/alert Behavior During Therapy: WFL for tasks assessed/performed Overall Cognitive Status: Within Functional Limits for tasks assessed                      General Comments General comments (skin integrity, edema, etc.): educated on "BE FAST" to promote early detection of stroke; pt verbalized understanding; nursing had given  handouts     Exercises        Assessment/Plan    PT Assessment Patent does not need any further PT services  PT Diagnosis     PT Problem List    PT Treatment Interventions     PT Goals (Current goals can be found in the Care Plan section) Acute Rehab PT Goals Patient Stated Goal: home today PT Goal  Formulation: No goals set, d/c therapy    Frequency     Barriers to discharge        Co-evaluation               End of Session   Activity Tolerance: Patient tolerated treatment well Patient left: in bed;with call bell/phone within reach Nurse Communication: Mobility status    Functional Assessment Tool Used: clinical judgement  Functional Limitation: Mobility: Walking and moving around Mobility: Walking and Moving Around Current Status [phone](G8978): 0 percent impaired, limited or restricted Mobility: Walking and Moving Around Goal Status [phone](G8979): 0 percent impaired, limited or restricted Mobility: Walking and Moving Around Discharge Status [phone](G8980): 0 percent impaired, limited or restricted    Time: [id number] PT Time Calculation (min): 14 min   Charges:   PT Evaluation $Initial PT Evaluation Tier I: 1 Procedure PT Treatments $Gait Training: 8-22 mins   PT G Codes:   Functional Assessment Tool Used: clinical judgement  Functional Limitation: Mobility: Walking and moving around    LamarWest, WinfieldBrittany N, [place]PT  [phone] 05/30/2014, 9:26 AM

## 2014-05-30 NOTE — Progress Notes (Signed)
  Echocardiogram 2D Echocardiogram has been performed.  Georgian CoWILLIAMS, [place]retna Bergin 05/30/2014, 4:56 PM

## 2014-05-30 NOTE — Progress Notes (Signed)
Pt discharge home for self care. Medication script , D/C instruction and follow up education given to Pt and family and both demonstrated good understanding condition at discharge is stable.

## 2014-05-30 NOTE — Discharge Summary (Signed)
Physician Discharge Summary  Fajr Fife MRN: [id number] DOB/AGE: 06/11/1956 58 y.o.  PCP: No primary provider on file.   Admit date: 05/28/2014 Discharge date: 05/30/2014  Discharge Diagnoses:      TIA (transient ischemic attack) Active Problems:   HTN (hypertension)  Follow up recommendations Follow up with PCP in 1 week Followup BMP/CBC Hemoglobin A1c in 3 months Review results of 2-D echo with the patient, results pending at this time    Medication List    STOP taking these medications       lisinopril-hydrochlorothiazide 20-25 MG per tablet  Commonly known as:  PRINZIDE,ZESTORETIC      TAKE these medications       albuterol 108 (90 BASE) MCG/ACT inhaler  Commonly known as:  PROVENTIL HFA;VENTOLIN HFA  Inhale 2 puffs into the lungs every 6 (six) hours as needed for wheezing or shortness of breath.     aspirin 81 MG chewable tablet  Chew 81 mg by mouth daily.     cyclobenzaprine 10 MG tablet  Commonly known as:  FLEXERIL  Take 10 mg by mouth 3 (three) times daily as needed for muscle spasms.     diltiazem 30 MG tablet  Commonly known as:  CARDIZEM  Take 30 mg by mouth 2 (two) times daily.     furosemide 40 MG tablet  Commonly known as:  LASIX  Take 1 tablet (40 mg total) by mouth daily.     lisinopril 20 MG tablet  Commonly known as:  PRINIVIL,ZESTRIL  Take 1 tablet (20 mg total) by mouth daily.     metFORMIN 500 MG tablet  Commonly known as:  GLUCOPHAGE  Take 1 tablet (500 mg total) by mouth 2 (two) times daily with a meal.     pantoprazole 40 MG tablet  Commonly known as:  PROTONIX  Take 40 mg by mouth daily.     PARoxetine 20 MG tablet  Commonly known as:  PAXIL  Take 20 mg by mouth daily.     potassium chloride 10 MEQ tablet  Commonly known as:  K-DUR  Take 10 mEq by mouth daily.     ranitidine 150 MG capsule  Commonly known as:  ZANTAC  Take 150 mg by mouth 2 (two) times daily.        Discharge Condition:   Disposition:  Final discharge disposition not confirmed   Consults: None  Significant Diagnostic Studies: Dg Chest 2 View  05/28/2014   CLINICAL DATA:  Right arm and right hand numbness, extending to the right shoulder. Shortness of breath. History of asthma.  EXAM: CHEST  2 VIEW  COMPARISON:  None.  FINDINGS: The lungs are well-aerated and clear. There is no evidence of focal opacification, pleural effusion or pneumothorax.  The heart is normal in size; the mediastinal contour is within normal limits. No acute osseous abnormalities are seen.  IMPRESSION: No acute cardiopulmonary process seen.   Electronically Signed   By: Garald Balding M.D.   On: 05/28/2014 22:39   Ct Head Wo Contrast  05/28/2014   CLINICAL DATA:  Right-sided headache and right-sided numbness.  EXAM: CT HEAD WITHOUT CONTRAST  TECHNIQUE: Contiguous axial images were obtained from the base of the skull through the vertex without intravenous contrast.  COMPARISON:  None.  FINDINGS: There is no evidence of acute infarction, mass lesion, or intra- or extra-axial hemorrhage on CT. Evaluation is mildly suboptimal due to motion artifact.  The posterior fossa, including the cerebellum, brainstem and fourth ventricle, is  within normal limits. The third and lateral ventricles, and basal ganglia are unremarkable in appearance. The cerebral hemispheres are symmetric in appearance, with normal gray-white differentiation. No mass effect or midline shift is seen.  There is no evidence of fracture; visualized osseous structures are unremarkable in appearance. The visualized portions of the orbits are within normal limits. The paranasal sinuses and mastoid air cells are well-aerated. No significant soft tissue abnormalities are seen.  IMPRESSION: Unremarkable noncontrast CT of the head.   Electronically Signed   By: Garald Balding M.D.   On: 05/28/2014 22:42   Mr Brain Wo Contrast  05/29/2014   CLINICAL DATA:  Transient right upper extremity weakness.  EXAM: MRI  HEAD WITHOUT CONTRAST  MRA HEAD WITHOUT CONTRAST  TECHNIQUE: Multiplanar, multiecho pulse sequences of the brain and surrounding structures were obtained without intravenous contrast. Angiographic images of the head were obtained using MRA technique without contrast.  COMPARISON:  Head CT 05/28/2014  FINDINGS: MRI HEAD FINDINGS  The brain has a normal appearance on all pulse sequences without evidence of malformation, atrophy, old or acute infarction, mass lesion, hemorrhage, hydrocephalus or extra-axial collection. No pituitary mass. No fluid in the sinuses, middle ears or mastoids. No skull or skullbase lesion. There is flow in the major vessels at the base of the brain. Major venous sinuses show flow.  MRA HEAD FINDINGS  Both internal carotid arteries are widely patent into the brain. No siphon stenosis. The anterior and middle cerebral vessels are patent without proximal stenosis, aneurysm or vascular malformation.  Both vertebral arteries are patent to the basilar. No vertebral stenosis. Subtraction artifact simulates a left vertebral stenosis. No basilar stenosis. Posterior circulation branch vessels are patent. Large posterior communicating arteries bilaterally.  IMPRESSION: Normal MRI of the brain.  Normal intracranial MR angiography.   Electronically Signed   By: Nelson Chimes M.D.   On: 05/29/2014 13:54   US Abdomen Complete  05/30/2014   CLINICAL DATA:  Left upper quadrant abdominal pain, hypertension, diabetes  EXAM: ULTRASOUND ABDOMEN COMPLETE  COMPARISON:  None.  FINDINGS: Gallbladder:  No gallstones or wall thickening visualized. No sonographic Murphy sign noted.  Common bile duct:  Diameter: 7 mm. Mildly prominent. No definite obstructing stone appreciated.  Liver:  Heterogeneous increased echogenicity compatible with hepatic steatosis or fatty infiltration. No definite focal hepatic abnormality or biliary dilatation within the liver. Patent portal vein with normal hepatopetal flow.  IVC:  No  abnormality visualized.  Pancreas:  Limited visualization.  No gross abnormality proximally.  Spleen:  Size and appearance within normal limits.  Right Kidney:  Length: 12 cm. Echogenicity within normal limits. No mass or hydronephrosis visualized.  Left Kidney:  Length: 13.1 cm. Echogenicity within normal limits. No mass or hydronephrosis visualized.  Abdominal aorta:  No aneurysm visualized.  Other findings:  None.  IMPRESSION: Hepatic steatosis.  Limited exam because of bowel gas and body habitus.  No acute finding demonstrated by ultrasound.   Electronically Signed   By: Daryll Brod M.D.   On: 05/30/2014 08:26   Mr Jodene Nam Head/brain Wo Cm  05/29/2014   CLINICAL DATA:  Transient right upper extremity weakness.  EXAM: MRI HEAD WITHOUT CONTRAST  MRA HEAD WITHOUT CONTRAST  TECHNIQUE: Multiplanar, multiecho pulse sequences of the brain and surrounding structures were obtained without intravenous contrast. Angiographic images of the head were obtained using MRA technique without contrast.  COMPARISON:  Head CT 05/28/2014  FINDINGS: MRI HEAD FINDINGS  The brain has a normal appearance on all pulse  sequences without evidence of malformation, atrophy, old or acute infarction, mass lesion, hemorrhage, hydrocephalus or extra-axial collection. No pituitary mass. No fluid in the sinuses, middle ears or mastoids. No skull or skullbase lesion. There is flow in the major vessels at the base of the brain. Major venous sinuses show flow.  MRA HEAD FINDINGS  Both internal carotid arteries are widely patent into the brain. No siphon stenosis. The anterior and middle cerebral vessels are patent without proximal stenosis, aneurysm or vascular malformation.  Both vertebral arteries are patent to the basilar. No vertebral stenosis. Subtraction artifact simulates a left vertebral stenosis. No basilar stenosis. Posterior circulation branch vessels are patent. Large posterior communicating arteries bilaterally.  IMPRESSION: Normal MRI  of the brain.  Normal intracranial MR angiography.   Electronically Signed   By: Nelson Chimes M.D.   On: 05/29/2014 13:54       Microbiology: No results found for this or any previous visit (from the past 240 hour(s)).   Labs: Results for orders placed during the hospital encounter of 05/28/14 (from the past 48 hour(s))  CBC     Status: None   Collection Time    05/28/14 10:27 PM      Result Value Ref Range   WBC 10.5  4.0 - 10.5 K/uL   RBC 4.41  3.87 - 5.11 MIL/uL   Hemoglobin 12.8  12.0 - 15.0 g/dL   HCT 38.4  36.0 - 46.0 %   MCV 87.1  78.0 - 100.0 fL   MCH 29.0  26.0 - 34.0 pg   MCHC 33.3  30.0 - 36.0 g/dL   RDW 13.3  11.5 - 15.5 %   Platelets 271  150 - 400 K/uL  COMPREHENSIVE METABOLIC PANEL     Status: Abnormal   Collection Time    05/28/14 10:27 PM      Result Value Ref Range   Sodium 143  137 - 147 mEq/L   Potassium 3.7  3.7 - 5.3 mEq/L   Chloride 101  96 - 112 mEq/L   CO2 30  19 - 32 mEq/L   Glucose, Bld 130 (*) 70 - 99 mg/dL   BUN 13  6 - 23 mg/dL   Creatinine, Ser 0.90  0.50 - 1.10 mg/dL   Calcium 9.7  8.4 - 10.5 mg/dL   Total Protein 7.2  6.0 - 8.3 g/dL   Albumin 3.8  3.5 - 5.2 g/dL   AST 22  0 - 37 U/L   ALT 27  0 - 35 U/L   Alkaline Phosphatase 66  39 - 117 U/L   Total Bilirubin 0.2 (*) 0.3 - 1.2 mg/dL   GFR calc non Af Amer 69 (*) >90 mL/min   GFR calc Af Amer 80 (*) >90 mL/min   Comment: (NOTE)     The eGFR has been calculated using the CKD EPI equation.     This calculation has not been validated in all clinical situations.     eGFR's persistently <90 mL/min signify possible Chronic Kidney     Disease.   Anion gap 12  5 - 15  TROPONIN I     Status: None   Collection Time    05/28/14 10:27 PM      Result Value Ref Range   Troponin I <0.30  <0.30 ng/mL   Comment:            Due to the release kinetics of cTnI,     a negative result within the first  hours     of the onset of symptoms does not rule out     myocardial infarction with certainty.      If myocardial infarction is still suspected,     repeat the test at appropriate intervals.  HEMOGLOBIN A1C     Status: Abnormal   Collection Time    05/29/14  5:09 AM      Result Value Ref Range   Hemoglobin A1C 7.0 (*) <5.7 %   Comment: (NOTE)                                                                               According to the ADA Clinical Practice Recommendations for 2011, when     HbA1c is used as a screening test:      >=6.5%   Diagnostic of Diabetes Mellitus               (if abnormal result is confirmed)     5.7-6.4%   Increased risk of developing Diabetes Mellitus     References:Diagnosis and Classification of Diabetes Mellitus,Diabetes     IDPO,2423,53(IRWER 1):S62-S69 and Standards of Medical Care in             Diabetes - 2011,Diabetes Care,2011,34 (Suppl 1):S11-S61.   Mean Plasma Glucose 154 (*) <117 mg/dL   Comment: Performed at [facility]     Status: None   Collection Time    05/29/14  5:09 AM      Result Value Ref Range   Cholesterol 168  0 - 200 mg/dL   Triglycerides 108  <150 mg/dL   HDL 58  >39 mg/dL   Total CHOL/HDL Ratio 2.9     VLDL 22  0 - 40 mg/dL   LDL Cholesterol 88  0 - 99 mg/dL   Comment:            Total Cholesterol/HDL:CHD Risk     Coronary Heart Disease Risk Table                         Men   Women      1/2 Average Risk   3.4   3.3      Average Risk       5.0   4.4      2 X Average Risk   9.6   7.1      3 X Average Risk  23.4   11.0                Use the calculated Patient Ratio     above and the CHD Risk Table     to determine the patient's CHD Risk.                ATP III CLASSIFICATION (LDL):      <100     mg/dL   Optimal      100-129  mg/dL   Near or Above                        Optimal      130-159  mg/dL   Borderline  160-189  mg/dL   High      >190     mg/dL   Very High  LIPASE, BLOOD     Status: None   Collection Time    05/29/14  5:09 AM      Result Value Ref Range   Lipase 27  11 -  59 U/L  URINALYSIS, ROUTINE W REFLEX MICROSCOPIC     Status: None   Collection Time    05/29/14  3:30 PM      Result Value Ref Range   Color, Urine YELLOW  YELLOW   APPearance CLEAR  CLEAR   Specific Gravity, Urine 1.019  1.005 - 1.030   pH 5.5  5.0 - 8.0   Glucose, UA NEGATIVE  NEGATIVE mg/dL   Hgb urine dipstick NEGATIVE  NEGATIVE   Bilirubin Urine NEGATIVE  NEGATIVE   Ketones, ur NEGATIVE  NEGATIVE mg/dL   Protein, ur NEGATIVE  NEGATIVE mg/dL   Urobilinogen, UA 0.2  0.0 - 1.0 mg/dL   Nitrite NEGATIVE  NEGATIVE   Leukocytes, UA NEGATIVE  NEGATIVE   Comment: MICROSCOPIC NOT DONE ON URINES WITH NEGATIVE PROTEIN, BLOOD, LEUKOCYTES, NITRITE, OR GLUCOSE <1000 mg/dL.  COMPREHENSIVE METABOLIC PANEL     Status: Abnormal   Collection Time    05/30/14  5:25 AM      Result Value Ref Range   Sodium 138  137 - 147 mEq/L   Potassium 4.1  3.7 - 5.3 mEq/L   Chloride 99  96 - 112 mEq/L   CO2 27  19 - 32 mEq/L   Glucose, Bld 121 (*) 70 - 99 mg/dL   BUN 10  6 - 23 mg/dL   Creatinine, Ser 0.71  0.50 - 1.10 mg/dL   Calcium 8.8  8.4 - 10.5 mg/dL   Total Protein 6.2  6.0 - 8.3 g/dL   Albumin 3.3 (*) 3.5 - 5.2 g/dL   AST 22  0 - 37 U/L   ALT 26  0 - 35 U/L   Alkaline Phosphatase 54  39 - 117 U/L   Total Bilirubin 0.4  0.3 - 1.2 mg/dL   GFR calc non Af Amer >90  >90 mL/min   GFR calc Af Amer >90  >90 mL/min   Comment: (NOTE)     The eGFR has been calculated using the CKD EPI equation.     This calculation has not been validated in all clinical situations.     eGFR's persistently <90 mL/min signify possible Chronic Kidney     Disease.   Anion gap 12  5 - 15  GLUCOSE, CAPILLARY     Status: None   Collection Time    05/30/14 12:20 PM      Result Value Ref Range   Glucose-Capillary 97  70 - 99 mg/dL   Comment 1 Documented in Chart     Comment 2 Notify RN       HPI : 58 y.o. female who presents to the ED at [facility] with transient RUE weakness . Symptoms lasted 15 mins in duration and resolved  without intervention en-route ED. Patient is now symptom free. Patient does not report any difficulty with speech or know of any facial droop during the arm symptom episode. She was also able to ambulate to car without difficulty she reports although she initially had been "afraid she wouldn't be able to get up". Patient has been transferred to [facility] for TIA work up   HOSPITAL COURSE: Probable TIA  R arm weakness  and numbness  Started on aspirin for secondary stroke prevention  Normal MRI MR angiography  MRA negative  Triglycerides 108  Hemoglobin A1c 7.0  PT/OT eval recommend no PT followup 2-D echo pending   Diabetes mellitus, type II  Hemoglobin A1c 7.0  Patient agreeable to be started on metformin , motivated to lose weight  Hypertension  Continue Cardizem, discontinued HCTZ and replaced it with Lasix because of some dependent edema, continue lisinopril  Needs repeat BMP to follow on potassium and kidney function  Left upper quadrant pain  Patient complaining of pain under her left breast  This is reproducible and likely musculoskeletal  Chest x-ray negative  Ultrasound of the abdomen shows hepatic steatosis, weight loss advised Doubt this is cardiac, cardiac enzymes negative Liver function and lipase within normal limits     Discharge Exam:  Blood pressure 134/60, pulse 65, temperature 98 F (36.7 C), temperature source Oral, resp. rate 18, height 5' 3"  (1.6 m), weight 135.5 kg (298 lb 11.6 oz), SpO2 100.00%.  Cardiovascular: Normal rate, regular rhythm, normal heart sounds and intact distal pulses.  Pulmonary/Chest: Effort normal and breath sounds normal. No respiratory distress.  Abdominal: Soft. Normal appearance and bowel sounds are normal. She exhibits no distension. There is no tenderness.  Musculoskeletal: She exhibits no edema and no tenderness.  Neurological: She is alert. No cranial nerve deficit.         Discharge Instructions   Diet - low sodium heart  healthy    Complete by:  As directed      Increase activity slowly    Complete by:  As directed            Follow-up Information   Follow up with PCP. Schedule an appointment as soon as possible for a visit in 1 week. [facility] followup)       Signed: Reyne Dumas 05/30/2014, 2:36 PM

## 2015-08-15 IMAGING — CT CT HEAD W/O CM
1 of 2 series · 15 of 30 positions shown, 19 images · non-contrast
Comparison: None.

CLINICAL DATA: Right-sided headache and right-sided numbness.

EXAM:
CT HEAD WITHOUT CONTRAST
TECHNIQUE: Contiguous axial images were obtained from the base of the skull
through the vertex without intravenous contrast.

[Series 2: head 4.8 h37s · axial · 0.42mm/px · z∈[-129,+31]mm · 15 of 36 slices shown, 19 images]
[im 2/36  brain]
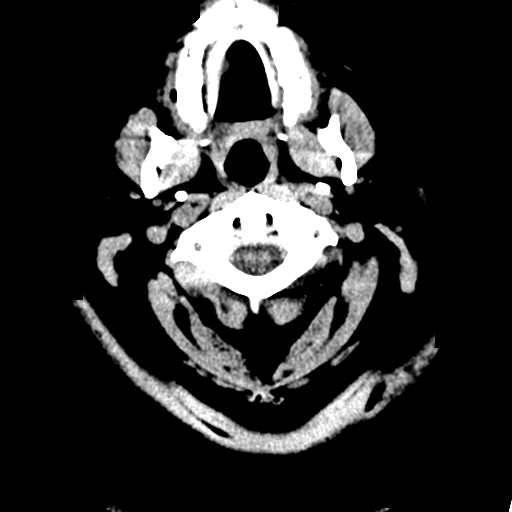
[im 2/36  bone]
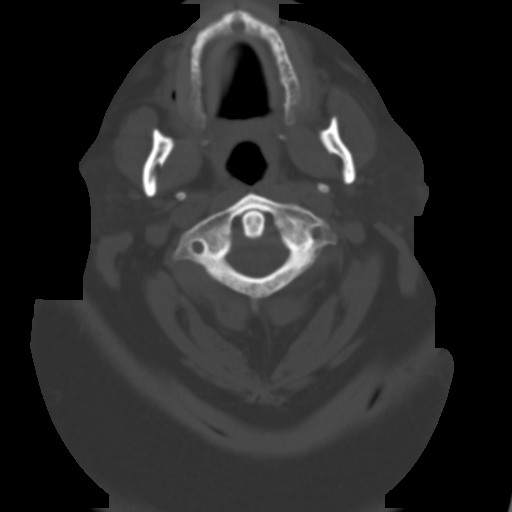
[im 6/36  brain]
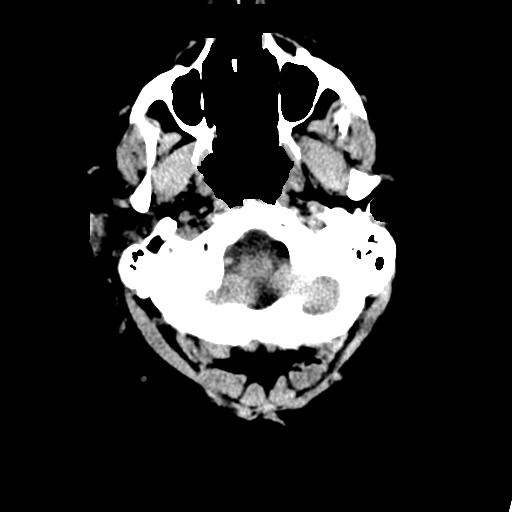
[im 7/36  brain]
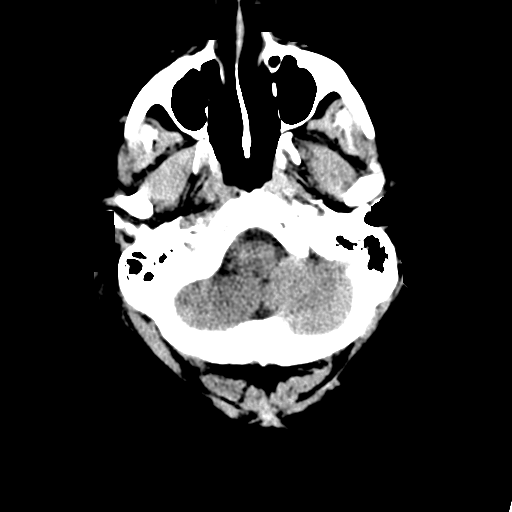
[im 9/36  brain]
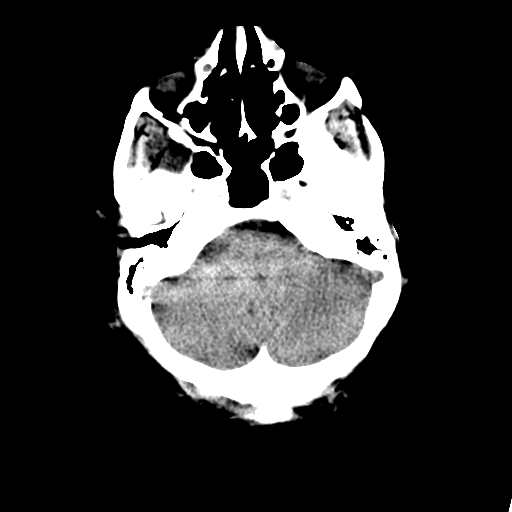
[im 12/36  brain]
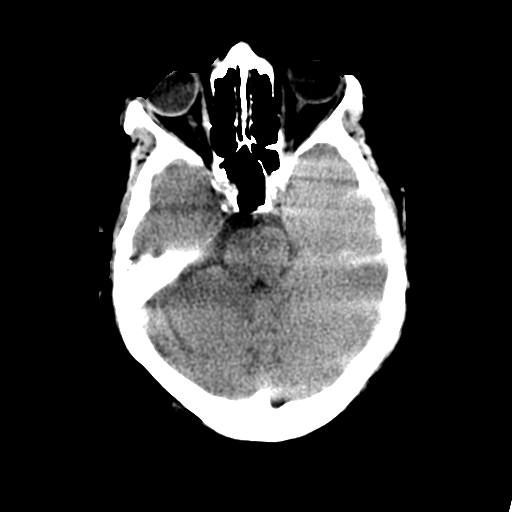
[im 12/36  bone]
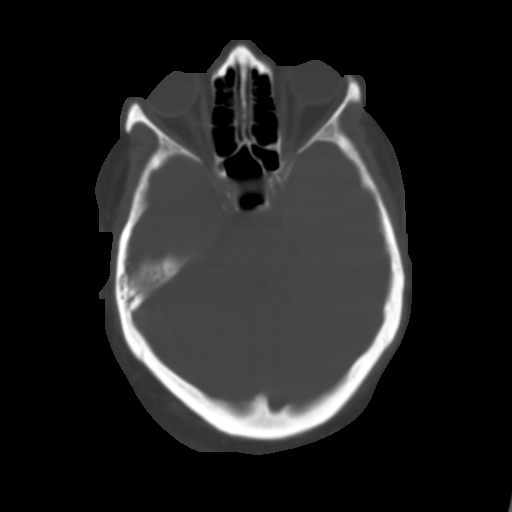
[im 14/36  brain]
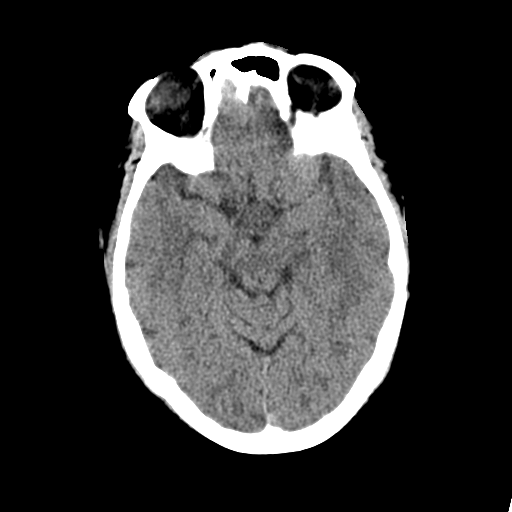
[im 16/36  brain]
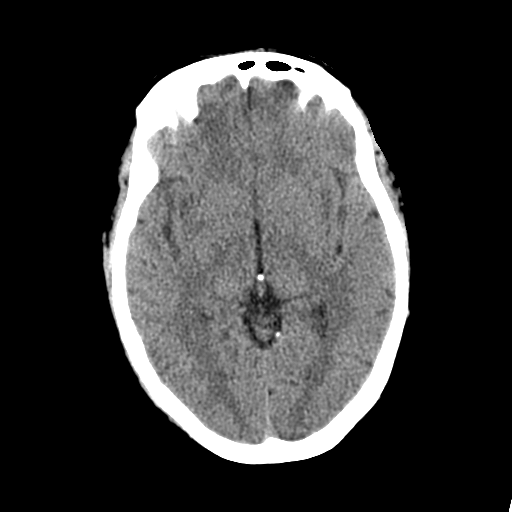
[im 19/36  brain]
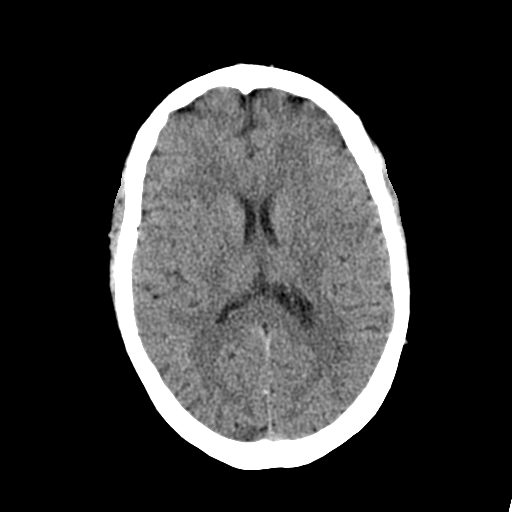
[im 21/36  brain]
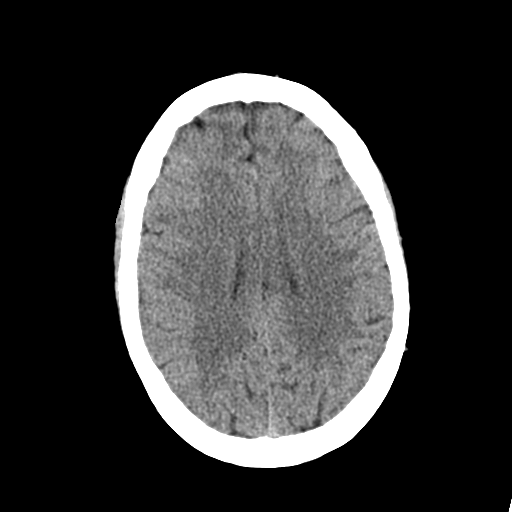
[im 21/36  bone]
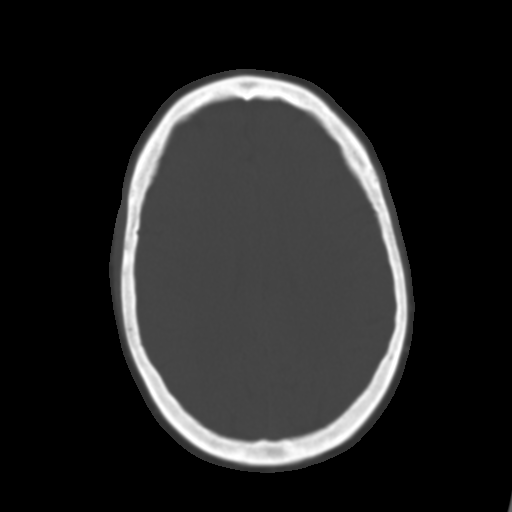
[im 22/36  brain]
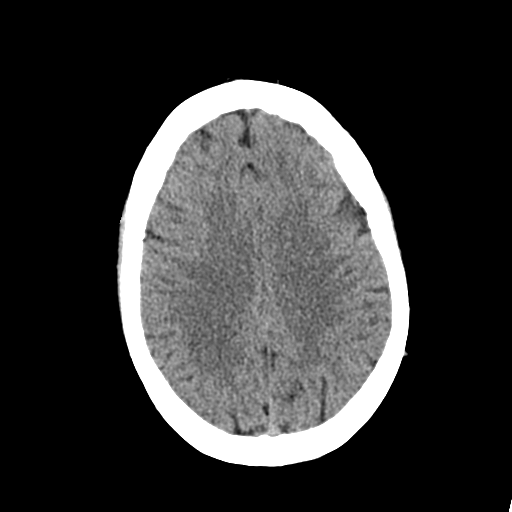
[im 26/36  brain]
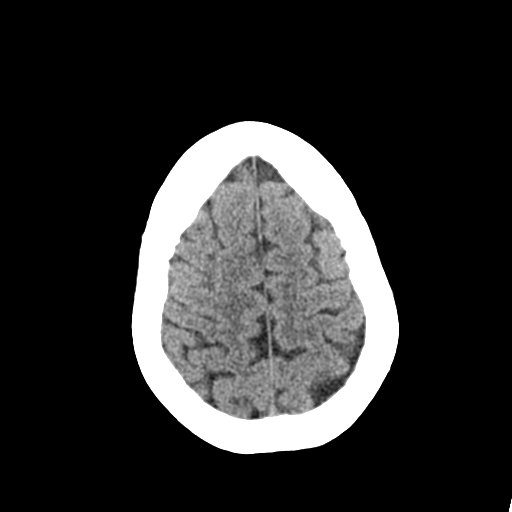
[im 27/36  brain]
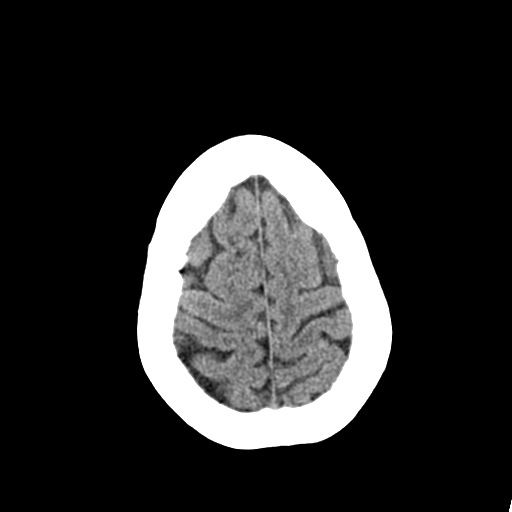
[im 29/36  brain]
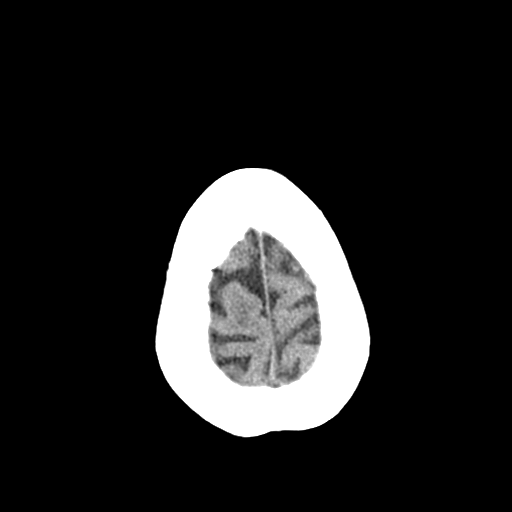
[im 29/36  bone]
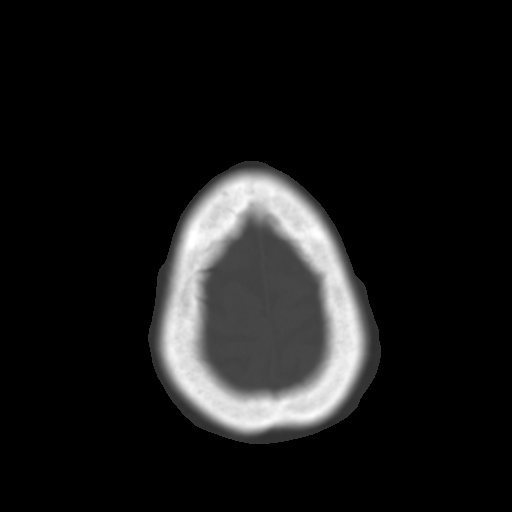
[im 32/36  brain]
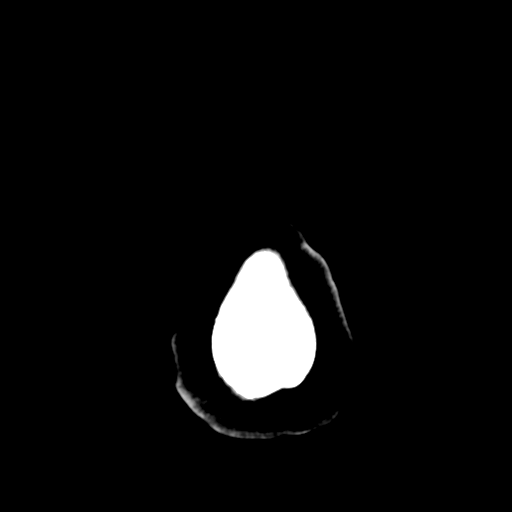
[im 34/36  brain]
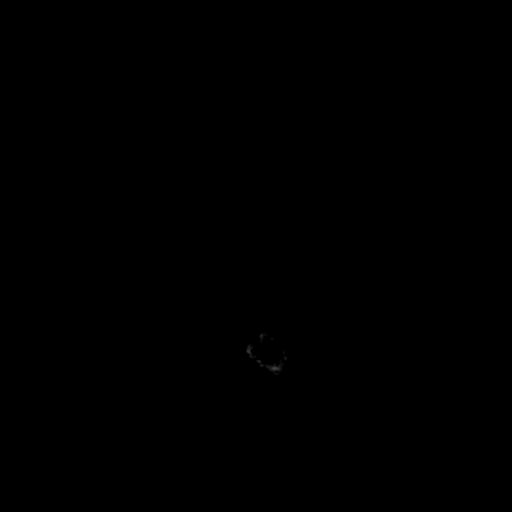

[15 of 30 positions shown; findings below may reference images not displayed]

FINDINGS: There is no evidence of acute infarction, mass lesion, or intra- or
extra-axial hemorrhage on CT. Evaluation is mildly suboptimal due to
motion artifact.

The posterior fossa, including the cerebellum, brainstem and fourth
ventricle, is within normal limits. The third and lateral
ventricles, and basal ganglia are unremarkable in appearance. The
cerebral hemispheres are symmetric in appearance, with normal
gray-white differentiation. No mass effect or midline shift is seen.

There is no evidence of fracture; visualized osseous structures are
unremarkable in appearance. The visualized portions of the orbits
are within normal limits. The paranasal sinuses and mastoid air
cells are well-aerated. No significant soft tissue abnormalities are
seen.
IMPRESSION: Unremarkable noncontrast CT of the head.

## 2015-08-17 IMAGING — US US ABDOMEN COMPLETE
1 series · 14 of 25 positions shown · non-contrast
Comparison: None.

CLINICAL DATA: Left upper quadrant abdominal pain, hypertension,
diabetes

EXAM:
ULTRASOUND ABDOMEN COMPLETE

[Series 1: us abdomen complete · 0.27mm/px · 14 of 78 slices shown]
[im 1/78]
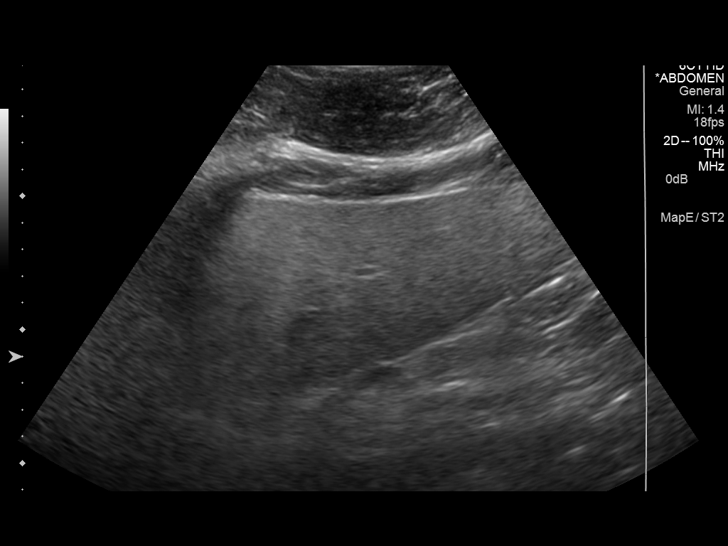
[im 7/78]
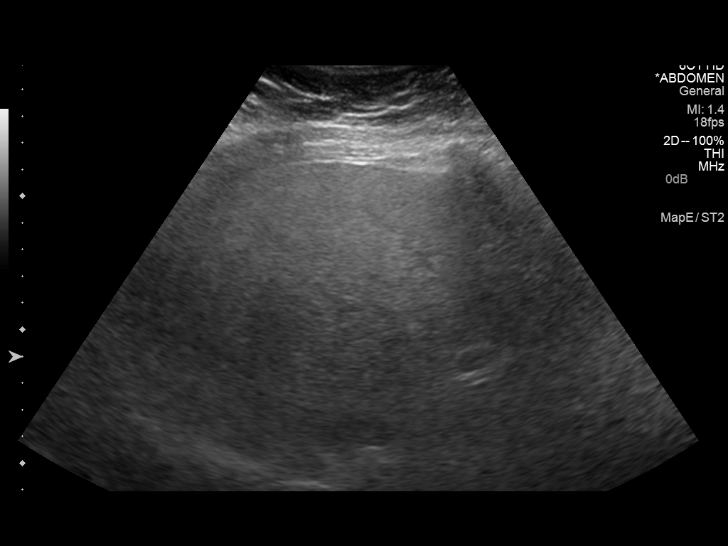
[im 13/78]
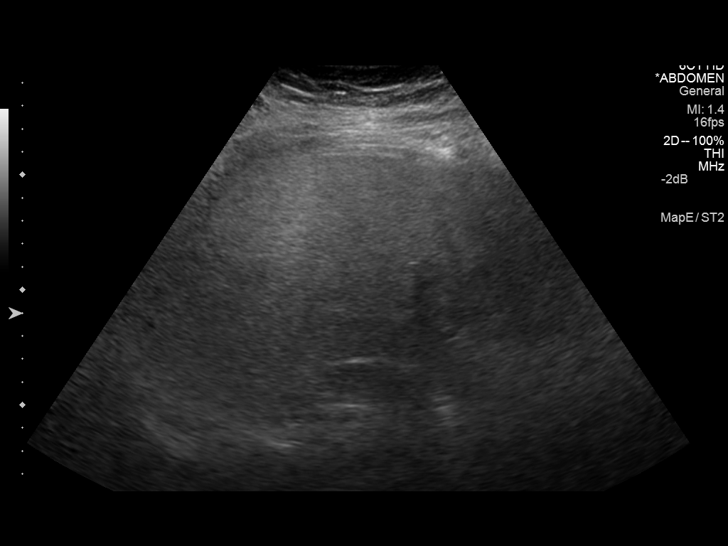
[im 20/78]
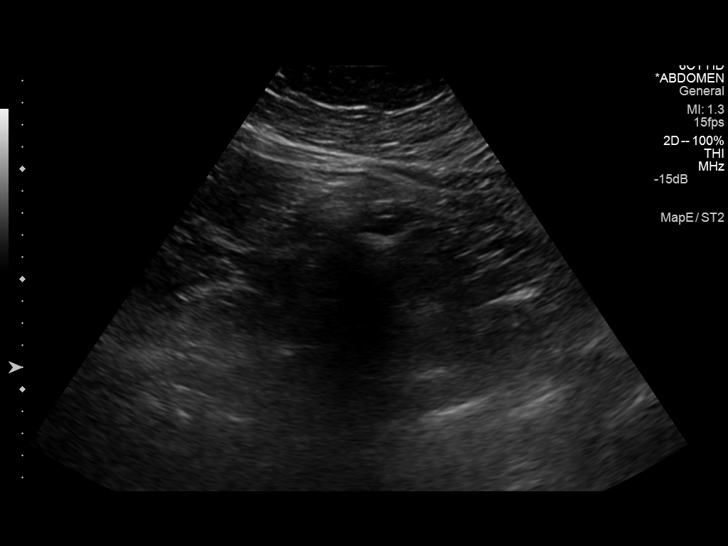
[im 26/78]
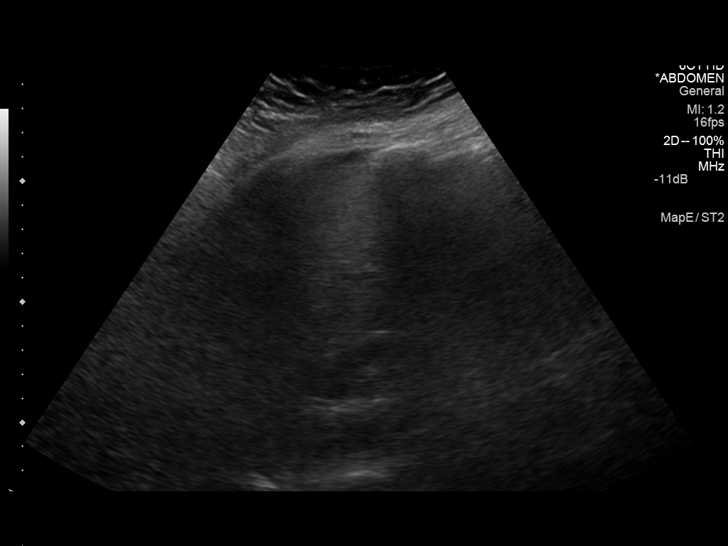
[im 29/78]
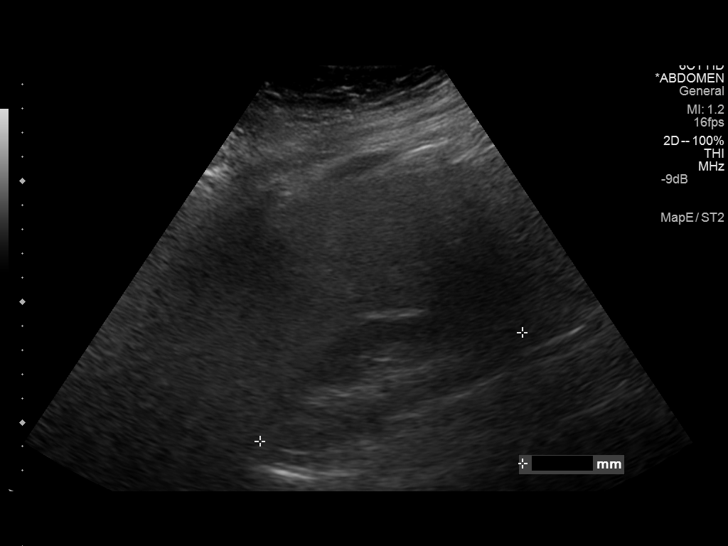
[im 36/78]
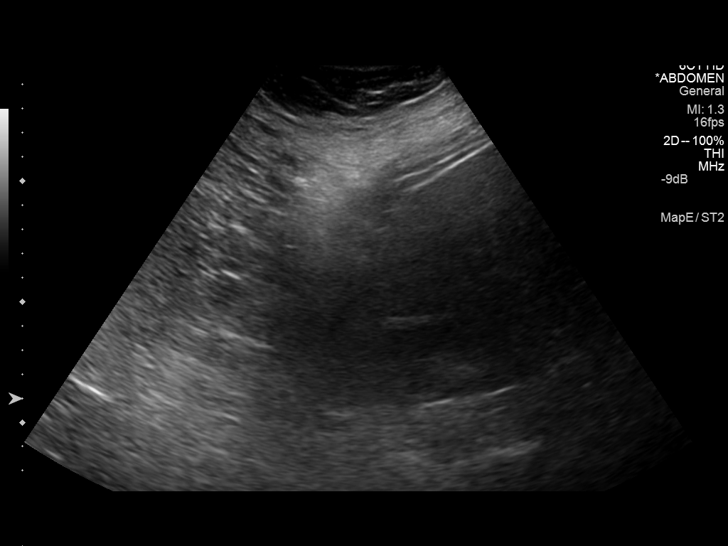
[im 42/78]
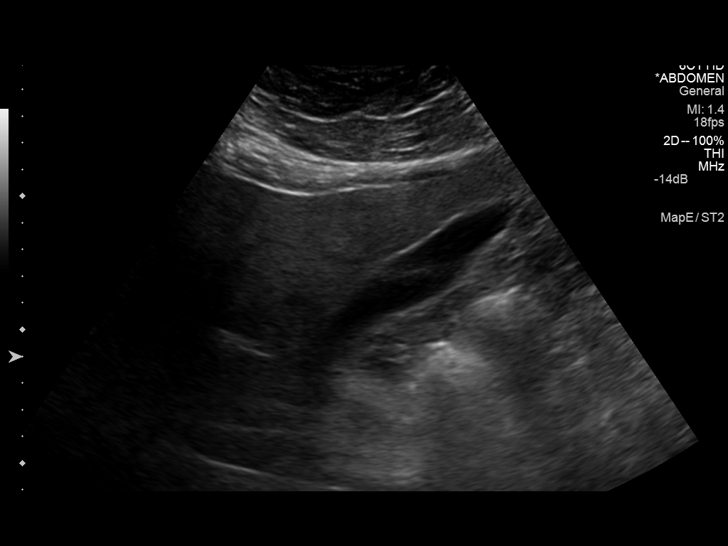
[im 49/78]
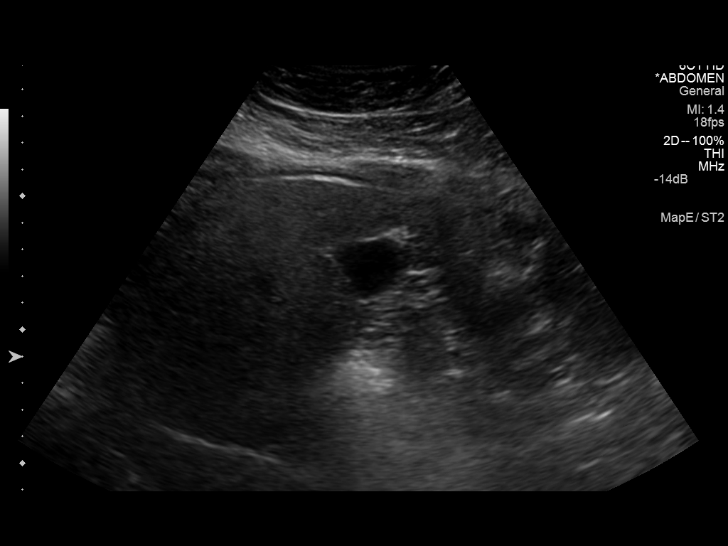
[im 52/78]
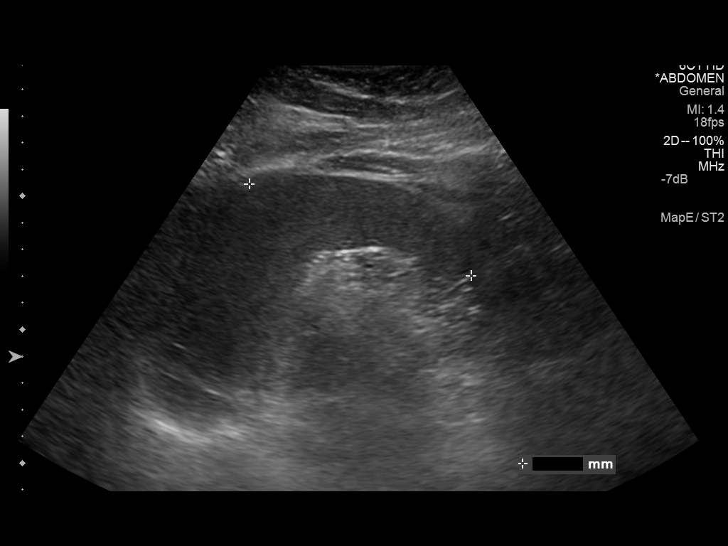
[im 58/78]
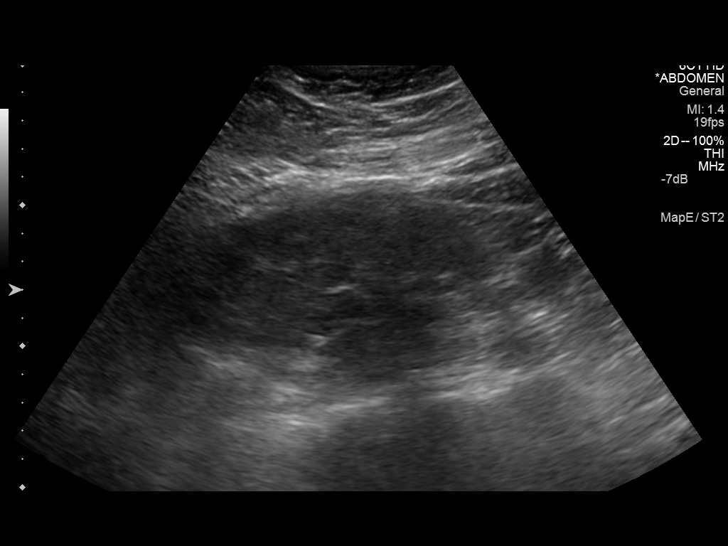
[im 65/78]
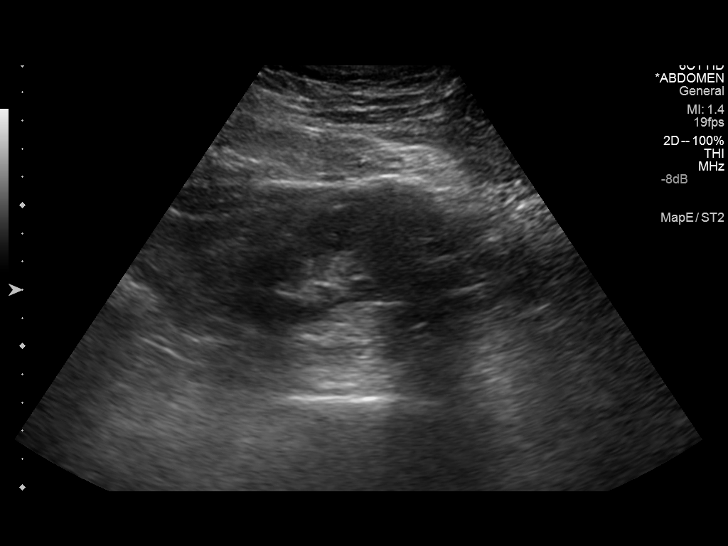
[im 71/78]
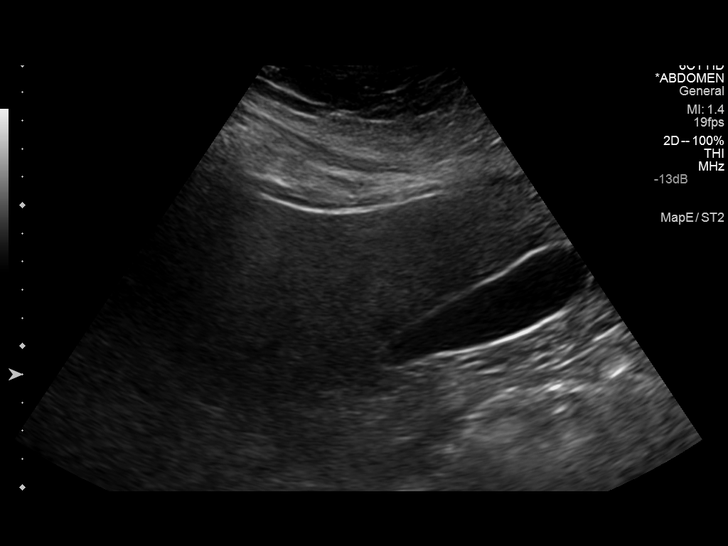
[im 78/78]
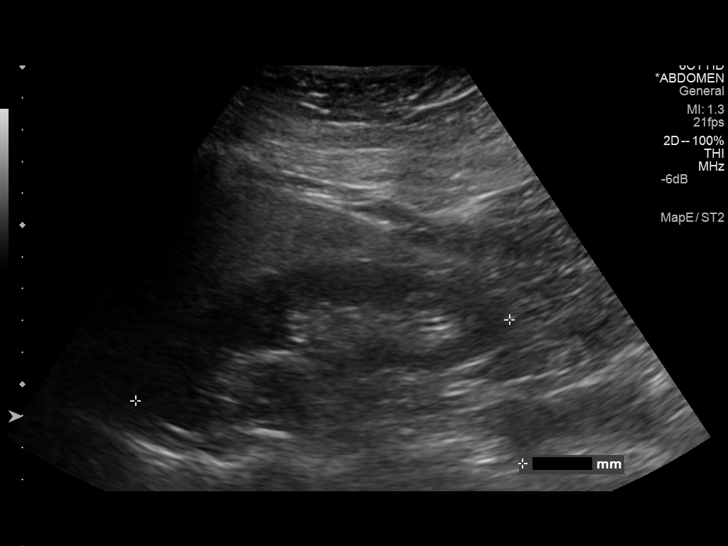

[14 of 25 positions shown; findings below may reference images not displayed]

FINDINGS: Gallbladder:

No gallstones or wall thickening visualized. No sonographic Murphy
sign noted.

Common bile duct:

Diameter: 7 mm. Mildly prominent. No definite obstructing stone
appreciated.

Liver:

Heterogeneous increased echogenicity compatible with hepatic
steatosis or fatty infiltration. No definite focal hepatic
abnormality or biliary dilatation within the liver. Patent portal
vein with normal hepatopetal flow.

IVC:

No abnormality visualized.

Pancreas:

Limited visualization.  No gross abnormality proximally.

Spleen:

Size and appearance within normal limits.

Right Kidney:

Length: 12 cm. Echogenicity within normal limits. No mass or
hydronephrosis visualized.

Left Kidney:

Length: 13.1 cm. Echogenicity within normal limits. No mass or
hydronephrosis visualized.

Abdominal aorta:

No aneurysm visualized.

Other findings:

None.
IMPRESSION: Hepatic steatosis.

Limited exam because of bowel gas and body habitus.

No acute finding demonstrated by ultrasound.
